# Patient Record
Sex: Male | Born: 1935 | Race: White | Hispanic: No | Marital: Married | Smoking: Former smoker
Health system: Southern US, Community
[De-identification: ages and names within clinical notes are randomized; demographics above are authoritative.]

## PROBLEM LIST (undated history)

## (undated) DIAGNOSIS — C801 Malignant (primary) neoplasm, unspecified: Secondary | ICD-10-CM

## (undated) HISTORY — PX: PACEMAKER INSERTION: SHX728

---

## 2007-12-28 ENCOUNTER — Emergency Department (HOSPITAL_BASED_OUTPATIENT_CLINIC_OR_DEPARTMENT_OTHER): Admission: EM | Admit: 2007-12-28 | Discharge: 2007-12-28 | Payer: Self-pay | Admitting: Emergency Medicine

## 2007-12-28 ENCOUNTER — Ambulatory Visit: Payer: Self-pay | Admitting: Diagnostic Radiology

## 2010-10-17 LAB — DIFFERENTIAL
Basophils Relative: 1 % (ref 0–1)
Eosinophils Relative: 1 % (ref 0–5)
Lymphocytes Relative: 16 % (ref 12–46)
Monocytes Absolute: 0.5 10*3/uL (ref 0.1–1.0)
Monocytes Relative: 7 % (ref 3–12)
Neutro Abs: 5.6 10*3/uL (ref 1.7–7.7)

## 2010-10-17 LAB — BASIC METABOLIC PANEL
CO2: 24 mEq/L (ref 19–32)
Calcium: 9.5 mg/dL (ref 8.4–10.5)
Chloride: 111 mEq/L (ref 96–112)
GFR calc Af Amer: >60 mL/min (ref >60)
Glucose, Bld: 97 mg/dL (ref 70–99)
Potassium: 4.7 mEq/L (ref 3.5–5.1)
Sodium: 146 mEq/L — ABNORMAL HIGH (ref 135–145)

## 2010-10-17 LAB — CBC
HCT: 48.7 % (ref 39.0–52.0)
Hemoglobin: 16.4 g/dL (ref 13.0–17.0)
MCHC: 33.7 g/dL (ref 30.0–36.0)
RBC: 4.98 MIL/uL (ref 4.22–5.81)

## 2010-10-17 LAB — APTT: aPTT: 42 seconds — ABNORMAL HIGH (ref 24–37)

## 2014-03-16 ENCOUNTER — Encounter (HOSPITAL_BASED_OUTPATIENT_CLINIC_OR_DEPARTMENT_OTHER): Payer: Self-pay | Admitting: *Deleted

## 2014-03-16 ENCOUNTER — Emergency Department (HOSPITAL_BASED_OUTPATIENT_CLINIC_OR_DEPARTMENT_OTHER)
Admission: EM | Admit: 2014-03-16 | Discharge: 2014-03-16 | Disposition: A | Discharge status: Home / Self Care | Payer: Medicare Other | Attending: Emergency Medicine | Admitting: Emergency Medicine

## 2014-03-16 ENCOUNTER — Emergency Department (HOSPITAL_BASED_OUTPATIENT_CLINIC_OR_DEPARTMENT_OTHER): Payer: Medicare Other

## 2014-03-16 DIAGNOSIS — S8992XA Unspecified injury of left lower leg, initial encounter: Secondary | ICD-10-CM | POA: Diagnosis present

## 2014-03-16 DIAGNOSIS — Z859 Personal history of malignant neoplasm, unspecified: Secondary | ICD-10-CM | POA: Diagnosis not present

## 2014-03-16 DIAGNOSIS — Y92810 Car as the place of occurrence of the external cause: Secondary | ICD-10-CM | POA: Diagnosis not present

## 2014-03-16 DIAGNOSIS — Z950 Presence of cardiac pacemaker: Secondary | ICD-10-CM | POA: Insufficient documentation

## 2014-03-16 DIAGNOSIS — Z7982 Long term (current) use of aspirin: Secondary | ICD-10-CM | POA: Diagnosis not present

## 2014-03-16 DIAGNOSIS — Z79899 Other long term (current) drug therapy: Secondary | ICD-10-CM | POA: Diagnosis not present

## 2014-03-16 DIAGNOSIS — X58XXXA Exposure to other specified factors, initial encounter: Secondary | ICD-10-CM | POA: Insufficient documentation

## 2014-03-16 DIAGNOSIS — S86912A Strain of unspecified muscle(s) and tendon(s) at lower leg level, left leg, initial encounter: Secondary | ICD-10-CM | POA: Diagnosis not present

## 2014-03-16 DIAGNOSIS — Y9389 Activity, other specified: Secondary | ICD-10-CM | POA: Insufficient documentation

## 2014-03-16 DIAGNOSIS — Y998 Other external cause status: Secondary | ICD-10-CM | POA: Diagnosis not present

## 2014-03-16 HISTORY — DX: Malignant (primary) neoplasm, unspecified: C80.1

## 2014-03-16 NOTE — Discharge Instructions (Signed)

## 2014-03-16 NOTE — ED Notes (Signed)
Pt lost his balance while using the "new" walker with wheels. States he heard a pop when he went down. Currently able to move leg/knee freely. ROM intact +pulses.

## 2014-03-16 NOTE — ED Notes (Signed)
Pt to triage in w/c, reports being treated "for water on my knee", then fell last night around 9pm. Lost his balance when getting out of the car, denies head injury or loc, states he heard a "pop" in left knee.

## 2014-03-16 NOTE — ED Provider Notes (Signed)
CSN: [id number]     Arrival date & time 03/16/14  1511 History   First MD Initiated Contact with Patient 03/16/14 1635     Chief Complaint  Patient presents with  . Knee Pain     (Consider location/radiation/quality/duration/timing/severity/associated sxs/prior Treatment) HPI Comments: Patient presents with left knee pain. Yesterday he was walking with a new walker that "got away from him" and he caught himself with his left leg. He twisted his left knee and felt a pop in his knee. He did not actually fall or hit his head. He denies any pain in his hip or his ankle. He has some pain and swelling in his left knee. He was having a hard time bearing weight on this morning it's been walking on it this afternoon. He denies any other injuries. He's been using Tylenol for pain.  Patient is a 81 y.o. male presenting with knee pain.  Knee Pain Associated symptoms: no back pain, no fever and no neck pain     Past Medical History  Diagnosis Date  . Cancer    Past Surgical History  Procedure Laterality Date  . Pacemaker insertion     History reviewed. No pertinent family history. History  Substance Use Topics  . Smoking status: Former Research scientist (life sciences)  . Smokeless tobacco: Not on file  . Alcohol Use: Not on file    Review of Systems  Constitutional: Negative for fever.  Gastrointestinal: Negative for nausea and vomiting.  Musculoskeletal: Positive for joint swelling and arthralgias. Negative for back pain and neck pain.  Skin: Negative for wound.  Neurological: Negative for weakness, numbness and headaches.      Allergies  Review of patient's allergies indicates no known allergies.  Home Medications   Prior to Admission medications   Medication Sig Start Date End Date Taking? Authorizing Provider  AMLODIPINE BESYLATE PO Take by mouth.   Yes Historical Provider, MD  ascorbic acid (VITAMIN C) 1000 MG tablet Take 1,000 mg by mouth daily.   Yes Historical Provider, MD  aspirin 81 MG tablet  Take 81 mg by mouth daily.   Yes Historical Provider, MD  b complex vitamins tablet Take 1 tablet by mouth daily.   Yes Historical Provider, MD  bicalutamide (CASODEX) 50 MG tablet Take 50 mg by mouth daily.   Yes Historical Provider, MD  bimatoprost (LUMIGAN) 0.01 % SOLN 1 drop at bedtime.   Yes Historical Provider, MD  calcium carbonate (OS-CAL) 600 MG TABS tablet Take 600 mg by mouth 2 (two) times daily with a meal.   Yes Historical Provider, MD  citalopram (CELEXA) 10 MG tablet Take 10 mg by mouth daily.   Yes Historical Provider, MD  dexlansoprazole (DEXILANT) 60 MG capsule Take 60 mg by mouth daily.   Yes Historical Provider, MD  nitroGLYCERIN (NITROSTAT) 0.4 MG SL tablet Place 0.4 mg under the tongue every 5 (five) minutes as needed for chest pain.   Yes Historical Provider, MD  simvastatin (ZOCOR) 20 MG tablet Take 20 mg by mouth daily.   Yes Historical Provider, MD  tamsulosin (FLOMAX) 0.4 MG CAPS capsule Take 0.4 mg by mouth.   Yes Historical Provider, MD  zolpidem (AMBIEN CR) 6.25 MG CR tablet Take 6.25 mg by mouth at bedtime as needed for sleep.   Yes Historical Provider, MD   There were no vitals taken for this visit. Physical Exam  Constitutional: He is oriented to person, place, and time. He appears well-developed and well-nourished.  HENT:  Head: Normocephalic and atraumatic.  Neck: Normal range of motion. Neck supple.  Cardiovascular: Normal rate.   Pulmonary/Chest: Effort normal.  Musculoskeletal: He exhibits edema and tenderness.  Mild swelling to the left knee. There is no bony tenderness. The ligaments appear grossly intact to Lachman maneuver, valgus and varus stress. He's able to do a straight leg raise. There is no pain on range of motion of the hip or the ankle. Pedal pulses are intact. He has normal sensation and motor function in the leg.  Neurological: He is alert and oriented to person, place, and time.  Skin: Skin is warm and dry.  Psychiatric: He has a normal  mood and affect.    ED Course  Procedures (including critical care time) Labs Review Labs Reviewed - No data to display  Imaging Review Dg Knee Complete 4 Views Left  03/16/2014   CLINICAL DATA:  Fall on left knee last night.  Heard a pop.  EXAM: LEFT KNEE - COMPLETE 4+ VIEW  COMPARISON:  None.  FINDINGS: Enthesopathic change at the quadriceps insertion and patellar tendon insertion. No acute fracture or dislocation. Possible small suprapatellar joint effusion. Mild medial and patellofemoral compartment joint space narrowing for age.  IMPRESSION: Possible small joint effusion.  No acute osseous abnormality.   Electronically Signed   By: Abigail Miyamoto M.D.   On: 03/16/2014 16:58     EKG Interpretation None      MDM   Final diagnoses:  Knee strain, left, initial encounter    Patient has been able to ambulate on the leg. Knee sleeve was placed. He was advised in ice and elevation. He will continue to use Tylenol for pain. He has an orthopedist at [facility] that he will follow-up with if this pain continues.    Malvin Johns, MD 03/16/14 [phone]

## 2014-03-27 ENCOUNTER — Emergency Department (HOSPITAL_BASED_OUTPATIENT_CLINIC_OR_DEPARTMENT_OTHER)
Admission: EM | Admit: 2014-03-27 | Discharge: 2014-03-27 | Disposition: A | Discharge status: Home / Self Care | Payer: Medicare Other | Attending: Emergency Medicine | Admitting: Emergency Medicine

## 2014-03-27 ENCOUNTER — Encounter (HOSPITAL_BASED_OUTPATIENT_CLINIC_OR_DEPARTMENT_OTHER): Payer: Self-pay | Admitting: *Deleted

## 2014-03-27 ENCOUNTER — Emergency Department (HOSPITAL_BASED_OUTPATIENT_CLINIC_OR_DEPARTMENT_OTHER): Payer: Medicare Other

## 2014-03-27 DIAGNOSIS — M25561 Pain in right knee: Secondary | ICD-10-CM

## 2014-03-27 DIAGNOSIS — M25551 Pain in right hip: Secondary | ICD-10-CM

## 2014-03-27 DIAGNOSIS — Z87891 Personal history of nicotine dependence: Secondary | ICD-10-CM | POA: Insufficient documentation

## 2014-03-27 DIAGNOSIS — Z859 Personal history of malignant neoplasm, unspecified: Secondary | ICD-10-CM | POA: Insufficient documentation

## 2014-03-27 DIAGNOSIS — S8991XA Unspecified injury of right lower leg, initial encounter: Secondary | ICD-10-CM | POA: Insufficient documentation

## 2014-03-27 DIAGNOSIS — Y9389 Activity, other specified: Secondary | ICD-10-CM | POA: Insufficient documentation

## 2014-03-27 DIAGNOSIS — Z7982 Long term (current) use of aspirin: Secondary | ICD-10-CM | POA: Diagnosis not present

## 2014-03-27 DIAGNOSIS — Y998 Other external cause status: Secondary | ICD-10-CM | POA: Insufficient documentation

## 2014-03-27 DIAGNOSIS — S79911A Unspecified injury of right hip, initial encounter: Secondary | ICD-10-CM | POA: Diagnosis not present

## 2014-03-27 DIAGNOSIS — W1839XA Other fall on same level, initial encounter: Secondary | ICD-10-CM | POA: Diagnosis not present

## 2014-03-27 DIAGNOSIS — Y9289 Other specified places as the place of occurrence of the external cause: Secondary | ICD-10-CM | POA: Insufficient documentation

## 2014-03-27 DIAGNOSIS — W19XXXA Unspecified fall, initial encounter: Secondary | ICD-10-CM

## 2014-03-27 MED ORDER — HYDROCODONE-ACETAMINOPHEN 5-325 MG PO TABS: 1.0000 | ORAL_TABLET | Freq: Four times a day (QID) | ORAL | Status: AC | PRN

## 2014-03-27 MED ORDER — HYDROCODONE-ACETAMINOPHEN 5-325 MG PO TABS
1.0000 | ORAL_TABLET | Freq: Once | ORAL | Status: AC
  Administered 2014-03-27: 1 via ORAL
  Filled 2014-03-27: qty 1

## 2014-03-27 NOTE — Discharge Instructions (Signed)
Knee Pain The knee is the complex joint between your thigh and your lower leg. It is made up of bones, tendons, ligaments, and cartilage. The bones that make up the knee are:  The femur in the thigh.  The tibia and fibula in the lower leg.  The patella or kneecap riding in the groove on the lower femur. CAUSES  Knee pain is a common complaint with many causes. A few of these causes are:  Injury, such as:  A ruptured ligament or tendon injury.  Torn cartilage.  Medical conditions, such as:  Gout  Arthritis  Infections  Overuse, over training, or overdoing a physical activity. Knee pain can be minor or severe. Knee pain can accompany debilitating injury. Minor knee problems often respond well to self-care measures or get well on their own. More serious injuries may need medical intervention or even surgery. SYMPTOMS The knee is complex. Symptoms of knee problems can vary widely. Some of the problems are:  Pain with movement and weight bearing.  Swelling and tenderness.  Buckling of the knee.  Inability to straighten or extend your knee.  Your knee locks and you cannot straighten it.  Warmth and redness with pain and fever.  Deformity or dislocation of the kneecap. DIAGNOSIS  Determining what is wrong may be very straight forward such as when there is an injury. It can also be challenging because of the complexity of the knee. Tests to make a diagnosis may include:  Your caregiver taking a history and doing a physical exam.  Routine X-rays can be used to rule out other problems. X-rays will not reveal a cartilage tear. Some injuries of the knee can be diagnosed by:  Arthroscopy a surgical technique by which a small video camera is inserted through tiny incisions on the sides of the knee. This procedure is used to examine and repair internal knee joint problems. Tiny instruments can be used during arthroscopy to repair the torn knee cartilage (meniscus).  Arthrography  is a radiology technique. A contrast liquid is directly injected into the knee joint. Internal structures of the knee joint then become visible on X-ray film.  An MRI scan is a non X-ray radiology procedure in which magnetic fields and a computer produce two- or three-dimensional images of the inside of the knee. Cartilage tears are often visible using an MRI scanner. MRI scans have largely replaced arthrography in diagnosing cartilage tears of the knee.  Blood work.  Examination of the fluid that helps to lubricate the knee joint (synovial fluid). This is done by taking a sample out using a needle and a syringe. TREATMENT The treatment of knee problems depends on the cause. Some of these treatments are:  Depending on the injury, proper casting, splinting, surgery, or physical therapy care will be needed.  Give yourself adequate recovery time. Do not overuse your joints. If you begin to get sore during workout routines, back off. Slow down or do fewer repetitions.  For repetitive activities such as cycling or running, maintain your strength and nutrition.  Alternate muscle groups. For example, if you are a weight lifter, work the upper body on one day and the lower body the next.  Either tight or weak muscles do not give the proper support for your knee. Tight or weak muscles do not absorb the stress placed on the knee joint. Keep the muscles surrounding the knee strong.  Take care of mechanical problems.  If you have flat feet, orthotics or special shoes may help.  See your caregiver if you need help.  Arch supports, sometimes with wedges on the inner or outer aspect of the heel, can help. These can shift pressure away from the side of the knee most bothered by osteoarthritis.  A brace called an "unloader" brace also may be used to help ease the pressure on the most arthritic side of the knee.  If your caregiver has prescribed crutches, braces, wraps or ice, use as directed. The acronym  for this is PRICE. This means protection, rest, ice, compression, and elevation.  Nonsteroidal anti-inflammatory drugs (NSAIDs), can help relieve pain. But if taken immediately after an injury, they may actually increase swelling. Take NSAIDs with food in your stomach. Stop them if you develop stomach problems. Do not take these if you have a history of ulcers, stomach pain, or bleeding from the bowel. Do not take without your caregiver's approval if you have problems with fluid retention, heart failure, or kidney problems.  For ongoing knee problems, physical therapy may be helpful.  Glucosamine and chondroitin are over-the-counter dietary supplements. Both may help relieve the pain of osteoarthritis in the knee. These medicines are different from the usual anti-inflammatory drugs. Glucosamine may decrease the rate of cartilage destruction.  Injections of a corticosteroid drug into your knee joint may help reduce the symptoms of an arthritis flare-up. They may provide pain relief that lasts a few months. You may have to wait a few months between injections. The injections do have a small increased risk of infection, water retention, and elevated blood sugar levels.  Hyaluronic acid injected into damaged joints may ease pain and provide lubrication. These injections may work by reducing inflammation. A series of shots may give relief for as long as 6 months.  Topical painkillers. Applying certain ointments to your skin may help relieve the pain and stiffness of osteoarthritis. Ask your pharmacist for suggestions. Many over the-counter products are approved for temporary relief of arthritis pain.  In some countries, doctors often prescribe topical NSAIDs for relief of chronic conditions such as arthritis and tendinitis. A review of treatment with NSAID creams found that they worked as well as oral medications but without the serious side effects. PREVENTION  Maintain a healthy weight. Extra pounds  put more strain on your joints.  Get strong, stay limber. Weak muscles are a common cause of knee injuries. Stretching is important. Include flexibility exercises in your workouts.  Be smart about exercise. If you have osteoarthritis, chronic knee pain or recurring injuries, you may need to change the way you exercise. This does not mean you have to stop being active. If your knees ache after jogging or playing basketball, consider switching to swimming, water aerobics, or other low-impact activities, at least for a few days a week. Sometimes limiting high-impact activities will provide relief.  Make sure your shoes fit well. Choose footwear that is right for your sport.  Protect your knees. Use the proper gear for knee-sensitive activities. Use kneepads when playing volleyball or laying carpet. Buckle your seat belt every time you drive. Most shattered kneecaps occur in car accidents.  Rest when you are tired. SEEK MEDICAL CARE IF:  You have knee pain that is continual and does not seem to be getting better.  SEEK IMMEDIATE MEDICAL CARE IF:  Your knee joint feels hot to the touch and you have a high fever. MAKE SURE YOU:   Understand these instructions.  Will watch your condition.  Will get help right away if you are not  doing well or get worse. Document Released: 10/26/2006 Document Revised: 03/23/2011 Document Reviewed: 10/26/2006 [facility] Patient Information 2015 [place], [place]. This information is not intended to replace advice given to you by your health care provider. Make sure you discuss any questions you have with your health care provider.  Hip Pain Your hip is the joint between your upper legs and your lower pelvis. The bones, cartilage, tendons, and muscles of your hip joint perform a lot of work each day supporting your body weight and allowing you to move around. Hip pain can range from a minor ache to severe pain in one or both of your hips. Pain may be felt on the inside  of the hip joint near the groin, or the outside near the buttocks and upper thigh. You may have swelling or stiffness as well.  HOME CARE INSTRUCTIONS   Take medicines only as directed by your health care provider.  Apply ice to the injured area:  Put ice in a plastic bag.  Place a towel between your skin and the bag.  Leave the ice on for 15-20 minutes at a time, 3-4 times a day.  Keep your leg raised (elevated) when possible to lessen swelling.  Avoid activities that cause pain.  Follow specific exercises as directed by your health care provider.  Sleep with a pillow between your legs on your most comfortable side.  Record how often you have hip pain, the location of the pain, and what it feels like. SEEK MEDICAL CARE IF:   You are unable to put weight on your leg.  Your hip is red or swollen or very tender to touch.  Your pain or swelling continues or worsens after 1 week.  You have increasing difficulty walking.  You have a fever. SEEK IMMEDIATE MEDICAL CARE IF:   You have fallen.  You have a sudden increase in pain and swelling in your hip. MAKE SURE YOU:   Understand these instructions.  Will watch your condition.  Will get help right away if you are not doing well or get worse. Document Released: 06/18/2009 Document Revised: 05/15/2013 Document Reviewed: 08/25/2012 [facility] Patient Information 2015 [place], [place]. This information is not intended to replace advice given to you by your health care provider. Make sure you discuss any questions you have with your health care provider.   Fall Prevention and Home Safety Falls cause injuries and can affect all age groups. It is possible to use preventive measures to significantly decrease the likelihood of falls. There are many simple measures which can make your home safer and prevent falls. OUTDOORS  Repair cracks and edges of walkways and driveways.  Remove high doorway thresholds.  Trim shrubbery on the  main path into your home.  Have good outside lighting.  Clear walkways of tools, rocks, debris, and clutter.  Check that handrails are not broken and are securely fastened. Both sides of steps should have handrails.  Have leaves, snow, and ice cleared regularly.  Use sand or salt on walkways during winter months.  In the garage, clean up grease or oil spills. BATHROOM  Install night lights.  Install grab bars by the toilet and in the tub and shower.  Use non-skid mats or decals in the tub or shower.  Place a plastic non-slip stool in the shower to sit on, if needed.  Keep floors dry and clean up all water on the floor immediately.  Remove soap buildup in the tub or shower on a regular basis.  Secure bath mats with non-slip, double-sided rug tape.  Remove throw rugs and tripping hazards from the floors. BEDROOMS  Install night lights.  Make sure a bedside light is easy to reach.  Do not use oversized bedding.  Keep a telephone by your bedside.  Have a firm chair with side arms to use for getting dressed.  Remove throw rugs and tripping hazards from the floor. KITCHEN  Keep handles on pots and pans turned toward the center of the stove. Use back burners when possible.  Clean up spills quickly and allow time for drying.  Avoid walking on wet floors.  Avoid hot utensils and knives.  Position shelves so they are not too high or low.  Place commonly used objects within easy reach.  If necessary, use a sturdy step stool with a grab bar when reaching.  Keep electrical cables out of the way.  Do not use floor polish or wax that makes floors slippery. If you must use wax, use non-skid floor wax.  Remove throw rugs and tripping hazards from the floor. STAIRWAYS  Never leave objects on stairs.  Place handrails on both sides of stairways and use them. Fix any loose handrails. Make sure handrails on both sides of the stairways are as long as the stairs.  Check  carpeting to make sure it is firmly attached along stairs. Make repairs to worn or loose carpet promptly.  Avoid placing throw rugs at the top or bottom of stairways, or properly secure the rug with carpet tape to prevent slippage. Get rid of throw rugs, if possible.  Have an electrician put in a light switch at the top and bottom of the stairs. OTHER FALL PREVENTION TIPS  Wear low-heel or rubber-soled shoes that are supportive and fit well. Wear closed toe shoes.  When using a stepladder, make sure it is fully opened and both spreaders are firmly locked. Do not climb a closed stepladder.  Add color or contrast paint or tape to grab bars and handrails in your home. Place contrasting color strips on first and last steps.  Learn and use mobility aids as needed. Install an electrical emergency response system.  Turn on lights to avoid dark areas. Replace light bulbs that burn out immediately. Get light switches that glow.  Arrange furniture to create clear pathways. Keep furniture in the same place.  Firmly attach carpet with non-skid or double-sided tape.  Eliminate uneven floor surfaces.  Select a carpet pattern that does not visually hide the edge of steps.  Be aware of all pets. OTHER HOME SAFETY TIPS  Set the water temperature for 120 F (48.8 C).  Keep emergency numbers on or near the telephone.  Keep smoke detectors on every level of the home and near sleeping areas. Document Released: 12/19/2001 Document Revised: 06/30/2011 Document Reviewed: 03/20/2011 [facility] Patient Information 2015 [place], [place]. This information is not intended to replace advice given to you by your health care provider. Make sure you discuss any questions you have with your health care provider.

## 2014-03-27 NOTE — ED Notes (Signed)
C/o fall last pm after standing and fell to right onto w/c and walker. C/o right groin pain. Also c/o right knee pain. No LOC.

## 2014-03-27 NOTE — ED Provider Notes (Signed)
TIME SEEN: 11:45 AM  CHIEF COMPLAINT: Fall, right hip pain  HPI: Pt is a 81 y.o. male with history of hypertension, GERD, hyperlipidemia who lives in independent living who presents emergency department with a fall and right hip pain. Reports that he was standing at the sink in his bathroom when he turned around too quickly and his walker was behind him and he fell on his right side. He is adamant that he did not hit his head or lose consciousness. Denies any preceding symptoms causing his fall including chest pain or shortness of breath, palpitations, dizziness, numbness, tingling or focal weakness. No recent infectious symptoms. No vomiting or diarrhea. He is only complaining of right hip pain. Patient is on aspirin 81 mg but no other anticoagulation.  ROS: See HPI Constitutional: no fever  Eyes: no drainage  ENT: no runny nose   Cardiovascular:  no chest pain  Resp: no SOB  GI: no vomiting GU: no dysuria Integumentary: no rash  Allergy: no hives  Musculoskeletal: no leg swelling  Neurological: no slurred speech ROS otherwise negative  PAST MEDICAL HISTORY/PAST SURGICAL HISTORY:  Past Medical History  Diagnosis Date  . Cancer     MEDICATIONS:  Prior to Admission medications   Medication Sig Start Date End Date Taking? Authorizing Provider  ascorbic acid (VITAMIN C) 1000 MG tablet Take 1,000 mg by mouth daily.   Yes Historical Provider, MD  aspirin 81 MG tablet Take 81 mg by mouth daily.   Yes Historical Provider, MD  bicalutamide (CASODEX) 50 MG tablet Take 50 mg by mouth daily.   Yes Historical Provider, MD  citalopram (CELEXA) 10 MG tablet Take 10 mg by mouth daily.   Yes Historical Provider, MD  dexlansoprazole (DEXILANT) 60 MG capsule Take 60 mg by mouth daily.   Yes Historical Provider, MD  geriatric multivitamins-minerals (ELDERTONIC/GEVRABON) ELIX Take 15 mLs by mouth daily.   Yes Historical Provider, MD  simvastatin (ZOCOR) 20 MG tablet Take 20 mg by mouth daily.   Yes  Historical Provider, MD  tamsulosin (FLOMAX) 0.4 MG CAPS capsule Take 0.4 mg by mouth.   Yes Historical Provider, MD  AMLODIPINE BESYLATE PO Take by mouth.    Historical Provider, MD  b complex vitamins tablet Take 1 tablet by mouth daily.    Historical Provider, MD  bimatoprost (LUMIGAN) 0.01 % SOLN 1 drop at bedtime.    Historical Provider, MD  calcium carbonate (OS-CAL) 600 MG TABS tablet Take 600 mg by mouth 2 (two) times daily with a meal.    Historical Provider, MD  nitroGLYCERIN (NITROSTAT) 0.4 MG SL tablet Place 0.4 mg under the tongue every 5 (five) minutes as needed for chest pain.    Historical Provider, MD  zolpidem (AMBIEN CR) 6.25 MG CR tablet Take 6.25 mg by mouth at bedtime as needed for sleep.    Historical Provider, MD    ALLERGIES:  No Known Allergies  SOCIAL HISTORY:  History  Substance Use Topics  . Smoking status: Former Research scientist (life sciences)  . Smokeless tobacco: Not on file  . Alcohol Use: Not on file    FAMILY HISTORY: No family history on file.  EXAM: BP 161/74 mmHg  Pulse 84  Temp(Src) 98.4 F (36.9 C) (Oral)  Resp 18  Ht 6\' 2"  (1.88 m)  Wt 210 lb (95.255 kg)  BMI 26.95 kg/m2  SpO2 97% CONSTITUTIONAL: Alert and oriented and responds appropriately to questions. Well-appearing; well-nourished; GCS 15 HEAD: Normocephalic; atraumatic EYES: Conjunctivae clear, PERRL, EOMI ENT: normal nose;  no rhinorrhea; moist mucous membranes; pharynx without lesions noted; no dental injury; no septal hematoma NECK: Supple, no meningismus, no LAD; no midline spinal tenderness, step-off or deformity CARD: RRR; S1 and S2 appreciated; no murmurs, no clicks, no rubs, no gallops RESP: Normal chest excursion without splinting or tachypnea; breath sounds clear and equal bilaterally; no wheezes, no rhonchi, no rales; chest wall stable, nontender to palpation ABD/GI: Normal bowel sounds; non-distended; soft, non-tender, no rebound, no guarding PELVIS:  stable, tender to palpation over the  right anterior interior hip without leg length discrepancy BACK:  The back appears normal and is non-tender to palpation, there is no CVA tenderness; no midline spinal tenderness, step-off or deformity EXT: Tender to palpation of the right anterior hip without leg length discrepancy, 2+ DP pulses bilaterally, sensation to light touch intact diffusely, no obvious bony deformity, no erythema or warmth, compartments are soft, decreased range of motion in the right hip secondary to pain, otherwise Normal ROM in all joints; otherwise extremities are non-tender to palpation; no edema; normal capillary refill; no cyanosis; no tenderness over the right distal femur or knee or tib/fib on the right side or ankle SKIN: Normal color for age and race; warm NEURO: Moves all extremities equally, sensation to light touch intact diffusely, cranial nerves II through XII intact PSYCH: The patient's mood and manner are appropriate. Grooming and personal hygiene are appropriate.  MEDICAL DECISION MAKING: Patient here with mechanical fall with right hip pain. We'll obtain x-rays, give Vicodin. No other injury on exam. Denies preceding symptoms that led to the fall. States he turned around too quickly and lost his balance. He is adamant that he did not hit his head. No neck or back pain on exam. Neurologically intact, hemodynamically stable. It is not on anticoagulation.  ED PROGRESS: X-ray show no acute injury. Will ambulate patient in the ED.   Patient son reports that he has not been ambulatory since a fall at the beginning of March 3 and left knee pain. He has a wheelchair that he uses at home. Patient is able to stand but not able to ambulate but this is secondary to being unsteady which is chronic. He is complaining of some right knee pain currently and has some tenderness now over the anterior knee without bony deformity or joint effusion. Will obtain an x-ray of this knee. No pain in the hip with standing. He is able  to stand for several minutes with minimal assistance.    Right knee x-ray is unremarkable. We'll discharge home with prescription for Vicodin for pain. He has a wheelchair and walker at home. He lives at [facility]. There is a primary care physician on site that he can see. Discussed return percussion with patient and son at bedside verbalize understanding and are comfortable with plan.  Gorman, DO 03/27/14 [id number]

## 2014-03-27 NOTE — ED Notes (Signed)
Called [facility] to transfer pt back to [facility].

## 2014-06-09 ENCOUNTER — Encounter (HOSPITAL_BASED_OUTPATIENT_CLINIC_OR_DEPARTMENT_OTHER): Payer: Self-pay | Admitting: *Deleted

## 2014-06-09 ENCOUNTER — Emergency Department (HOSPITAL_BASED_OUTPATIENT_CLINIC_OR_DEPARTMENT_OTHER)
Admission: EM | Admit: 2014-06-09 | Discharge: 2014-06-09 | Disposition: A | Discharge status: Home / Self Care | Payer: Medicare Other | Attending: Emergency Medicine | Admitting: Emergency Medicine

## 2014-06-09 DIAGNOSIS — H1132 Conjunctival hemorrhage, left eye: Secondary | ICD-10-CM

## 2014-06-09 DIAGNOSIS — Z7982 Long term (current) use of aspirin: Secondary | ICD-10-CM | POA: Insufficient documentation

## 2014-06-09 DIAGNOSIS — Z79899 Other long term (current) drug therapy: Secondary | ICD-10-CM | POA: Diagnosis not present

## 2014-06-09 DIAGNOSIS — H5712 Ocular pain, left eye: Secondary | ICD-10-CM | POA: Diagnosis present

## 2014-06-09 DIAGNOSIS — Z859 Personal history of malignant neoplasm, unspecified: Secondary | ICD-10-CM | POA: Insufficient documentation

## 2014-06-09 DIAGNOSIS — Z87891 Personal history of nicotine dependence: Secondary | ICD-10-CM | POA: Insufficient documentation

## 2014-06-09 LAB — CBC
HCT: 39.1 % (ref 39.0–52.0)
Hemoglobin: 12.4 g/dL — ABNORMAL LOW (ref 13.0–17.0)
MCH: 29.2 pg (ref 26.0–34.0)
MCHC: 31.7 g/dL (ref 30.0–36.0)
MCV: 92.2 fL (ref 78.0–100.0)
Platelets: 121 10*3/uL — ABNORMAL LOW (ref 150–400)
RBC: 4.24 MIL/uL (ref 4.22–5.81)
RDW: 15.8 % — ABNORMAL HIGH (ref 11.5–15.5)
WBC: 5.1 10*3/uL (ref 4.0–10.5)

## 2014-06-09 MED ORDER — FLUORESCEIN SODIUM 1 MG OP STRP
1.0000 | ORAL_STRIP | Freq: Once | OPHTHALMIC | Status: AC
Start: 2014-06-09 — End: 2014-06-09
  Administered 2014-06-09: 1 via OPHTHALMIC
  Filled 2014-06-09: qty 1

## 2014-06-09 NOTE — ED Provider Notes (Signed)
CSN: [id number]     Arrival date & time 06/09/14  1900 History   This chart was scribed for Kyle Greek, MD by Randa Evens, ED Scribe. This patient was seen in room MH06/MH06 and the patient's care was started at 7:20 PM.     Chief Complaint  Patient presents with  . Eye Pain   Patient is a 81 y.o. male presenting with eye pain. The history is provided by the patient. No language interpreter was used.  Eye Pain   HPI Comments: Kyle Wolf is a 81 y.o. male who presents to the Emergency Department complaining of new left eye pain onset this evening. Pt has associated eye redness. Pt states he is unsure if there was injury to the eye but states that he may have swiped it while sorting through mail. Denies being on blood thinners. Pt denies sneezing or coughing.   Past Medical History  Diagnosis Date  . Cancer    Past Surgical History  Procedure Laterality Date  . Pacemaker insertion     No family history on file. History  Substance Use Topics  . Smoking status: Former Research scientist (life sciences)  . Smokeless tobacco: Not on file  . Alcohol Use: No    Review of Systems  HENT: Negative for sneezing.   Eyes: Positive for pain and redness.  Respiratory: Negative for cough.   All other systems reviewed and are negative.    Allergies  Review of patient's allergies indicates no known allergies.  Home Medications   Prior to Admission medications   Medication Sig Start Date End Date Taking? Authorizing Provider  Mirabegron (MYRBETRIQ PO) Take by mouth.   Yes Historical Provider, MD  AMLODIPINE BESYLATE PO Take by mouth.    Historical Provider, MD  ascorbic acid (VITAMIN C) 1000 MG tablet Take 1,000 mg by mouth daily.    Historical Provider, MD  aspirin 81 MG tablet Take 81 mg by mouth daily.    Historical Provider, MD  b complex vitamins tablet Take 1 tablet by mouth daily.    Historical Provider, MD  bicalutamide (CASODEX) 50 MG tablet Take 50 mg by mouth daily.    Historical  Provider, MD  bimatoprost (LUMIGAN) 0.01 % SOLN 1 drop at bedtime.    Historical Provider, MD  calcium carbonate (OS-CAL) 600 MG TABS tablet Take 600 mg by mouth 2 (two) times daily with a meal.    Historical Provider, MD  citalopram (CELEXA) 10 MG tablet Take 10 mg by mouth daily.    Historical Provider, MD  dexlansoprazole (DEXILANT) 60 MG capsule Take 60 mg by mouth daily.    Historical Provider, MD  geriatric multivitamins-minerals (ELDERTONIC/GEVRABON) ELIX Take 15 mLs by mouth daily.    Historical Provider, MD  HYDROcodone-acetaminophen (NORCO/VICODIN) 5-325 MG per tablet Take 1 tablet by mouth every 6 (six) hours as needed. 03/27/14   Kristen N Ward, DO  nitroGLYCERIN (NITROSTAT) 0.4 MG SL tablet Place 0.4 mg under the tongue every 5 (five) minutes as needed for chest pain.    Historical Provider, MD  simvastatin (ZOCOR) 20 MG tablet Take 20 mg by mouth daily.    Historical Provider, MD  tamsulosin (FLOMAX) 0.4 MG CAPS capsule Take 0.4 mg by mouth.    Historical Provider, MD  zolpidem (AMBIEN CR) 6.25 MG CR tablet Take 6.25 mg by mouth at bedtime as needed for sleep.    Historical Provider, MD   BP 129/69 mmHg  Pulse 77  Temp(Src) 97.5 F (36.4 C) (Oral)  Resp  18  Ht 6\' 2"  (1.88 m)  Wt 200 lb (90.719 kg)  BMI 25.67 kg/m2  SpO2 98%   Physical Exam  Constitutional: He is oriented to person, place, and time. He appears well-developed and well-nourished. No distress.  HENT:  Head: Normocephalic and atraumatic.  Right Ear: Hearing normal.  Left Ear: Hearing normal.  Nose: Nose normal.  Mouth/Throat: Oropharynx is clear and moist and mucous membranes are normal.  Eyes: EOM are normal. Pupils are equal, round, and reactive to light. Left conjunctiva has a hemorrhage.  Left eye circumferential subconjunctival hemorrhage  fluoroscein exam reveals normal cornea, no uptake.  Neck: Normal range of motion. Neck supple.  Cardiovascular: Regular rhythm, S1 normal and S2 normal.  Exam  reveals no gallop and no friction rub.   No murmur heard. Pulmonary/Chest: Effort normal and breath sounds normal. No respiratory distress. He exhibits no tenderness.  Abdominal: Soft. Normal appearance and bowel sounds are normal. There is no hepatosplenomegaly. There is no tenderness. There is no rebound, no guarding, no tenderness at McBurney's point and negative Murphy's sign. No hernia.  Musculoskeletal: Normal range of motion.  Neurological: He is alert and oriented to person, place, and time. He has normal strength. No cranial nerve deficit or sensory deficit. Coordination normal. GCS eye subscore is 4. GCS verbal subscore is 5. GCS motor subscore is 6.  Skin: Skin is warm, dry and intact. No rash noted. No cyanosis.  Psychiatric: He has a normal mood and affect. His speech is normal and behavior is normal. Thought content normal.  Nursing note and vitals reviewed.   ED Course  Procedures (including critical care time) DIAGNOSTIC STUDIES: Oxygen Saturation is 98% on RA, normal by my interpretation.    COORDINATION OF CARE: 7:23 PM-Discussed treatment plan with pt at bedside and pt agreed to plan.     Labs Review Labs Reviewed - No data to display  Imaging Review No results found.   EKG Interpretation None      MDM   Final diagnoses:  None  subconjunctival hemorrhage  presented to the ER for evaluation of subconjunctival hemorrhage.patient was unaware of any injury to the eye. He has not had any change in his vision. Son reports that it started with some bleeding on the outside corner of the eye, then the whole eye filled with blood. Examination reveals a circumferential subconjunctival hemorrhage corneal exam is normal. Patient does have mild pancytopenia, but is at his baseline. No further intervention necessary.  I personally performed the services described in this documentation, which was scribed in my presence. The recorded information has been reviewed and is  accurate.      Kyle Greek, MD 06/09/14 2011

## 2014-06-09 NOTE — Discharge Instructions (Signed)
Subconjunctival Hemorrhage °A subconjunctival hemorrhage is a bright red patch covering a portion of the white of the eye. The white part of the eye is called the sclera, and it is covered by a thin membrane called the conjunctiva. This membrane is clear, except for tiny blood vessels that you can see with the naked eye. When your eye is irritated or inflamed and becomes red, it is because the vessels in the conjunctiva are swollen. °Sometimes, a blood vessel in the conjunctiva can break and bleed. When this occurs, the blood builds up between the conjunctiva and the sclera, and spreads out to create a red area. The red spot may be very small at first. It may then spread to cover a larger part of the surface of the eye, or even all of the visible white part of the eye. °In almost all cases, the blood will go away and the eye will become white again. Before completely dissolving, however, the red area may spread. It may also become brownish-yellow in color before going away. If a lot of blood collects under the conjunctiva, it may look like a bulge on the surface of the eye. This looks scary, but it will also eventually flatten out and go away. Subconjunctival hemorrhages do not cause pain, but if swollen, may cause a feeling of irritation. There is no effect on vision.  °CAUSES  °· The most common cause is mild trauma (rubbing the eye, irritation). °· Subconjunctival hemorrhages can happen because of coughing or straining (lifting heavy objects), vomiting, or sneezing. °· In some cases, your doctor may want to check your blood pressure. High blood pressure can also cause a subconjunctival hemorrhage. °· Severe trauma or blunt injuries. °· Diseases that affect blood clotting (hemophilia, leukemia). °· Abnormalities of blood vessels behind the eye (carotid cavernous sinus fistula). °· Tumors behind the eye. °· Certain drugs (aspirin, Coumadin, heparin). °· Recent eye surgery. °HOME CARE INSTRUCTIONS  °· Do not worry  about the appearance of your eye. You may continue your usual activities. °· Often, follow-up is not necessary. °SEEK MEDICAL CARE IF:  °· Your eye becomes painful. °· The bleeding does not disappear within 3 weeks. °· Bleeding occurs elsewhere, for example, under the skin, in the mouth, or in the other eye. °· You have recurring subconjunctival hemorrhages. °SEEK IMMEDIATE MEDICAL CARE IF:  °· Your vision changes or you have difficulty seeing. °· You develop a severe headache, persistent vomiting, confusion, or abnormal drowsiness (lethargy). °· Your eye seems to bulge or protrude from the eye socket. °· You notice the sudden appearance of bruises or have spontaneous bleeding elsewhere on your body. °Document Released: 12/29/2004 Document Revised: 05/15/2013 Document Reviewed: 11/26/2008 °[employer]® Patient Information ©2015 [employer]. This information is not intended to replace advice given to you by your health care provider. Make sure you discuss any questions you have with your health care provider. ° °

## 2014-06-09 NOTE — ED Notes (Signed)
Pt c/o left eye hurting that started this evening. Injury unknown but states he was sorting his mail and could have hit his eye. Bloody sclera noted on exam. Denies any vision problems.

## 2016-04-18 IMAGING — CR DG KNEE COMPLETE 4+V*L*
4 series · 4 of 4 positions shown · non-contrast
Comparison: None.

CLINICAL DATA: Fall on left knee last night.  Heard a pop.

EXAM:
LEFT KNEE - COMPLETE 4+ VIEW

[t knee ap left]
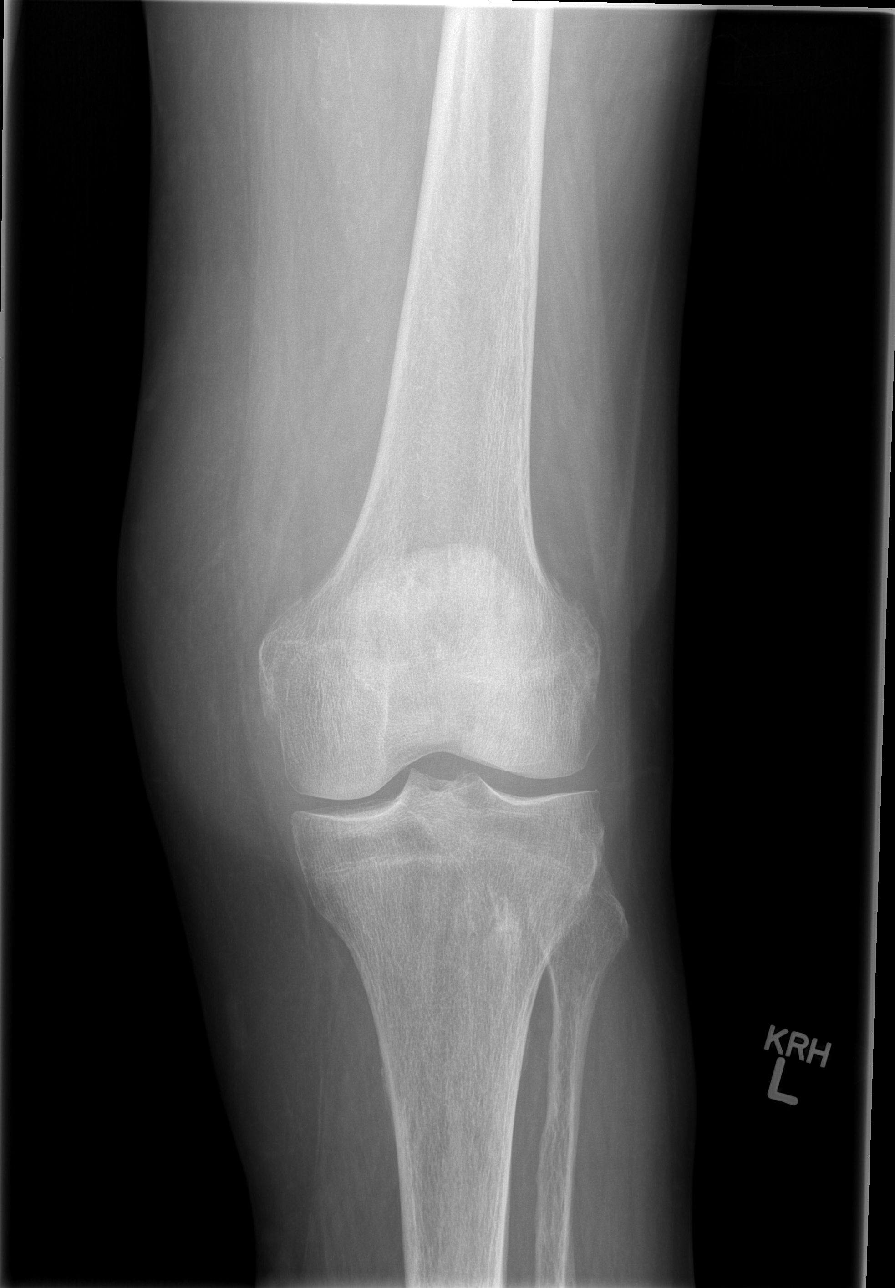

[t knee oblique left (1 of 2)]
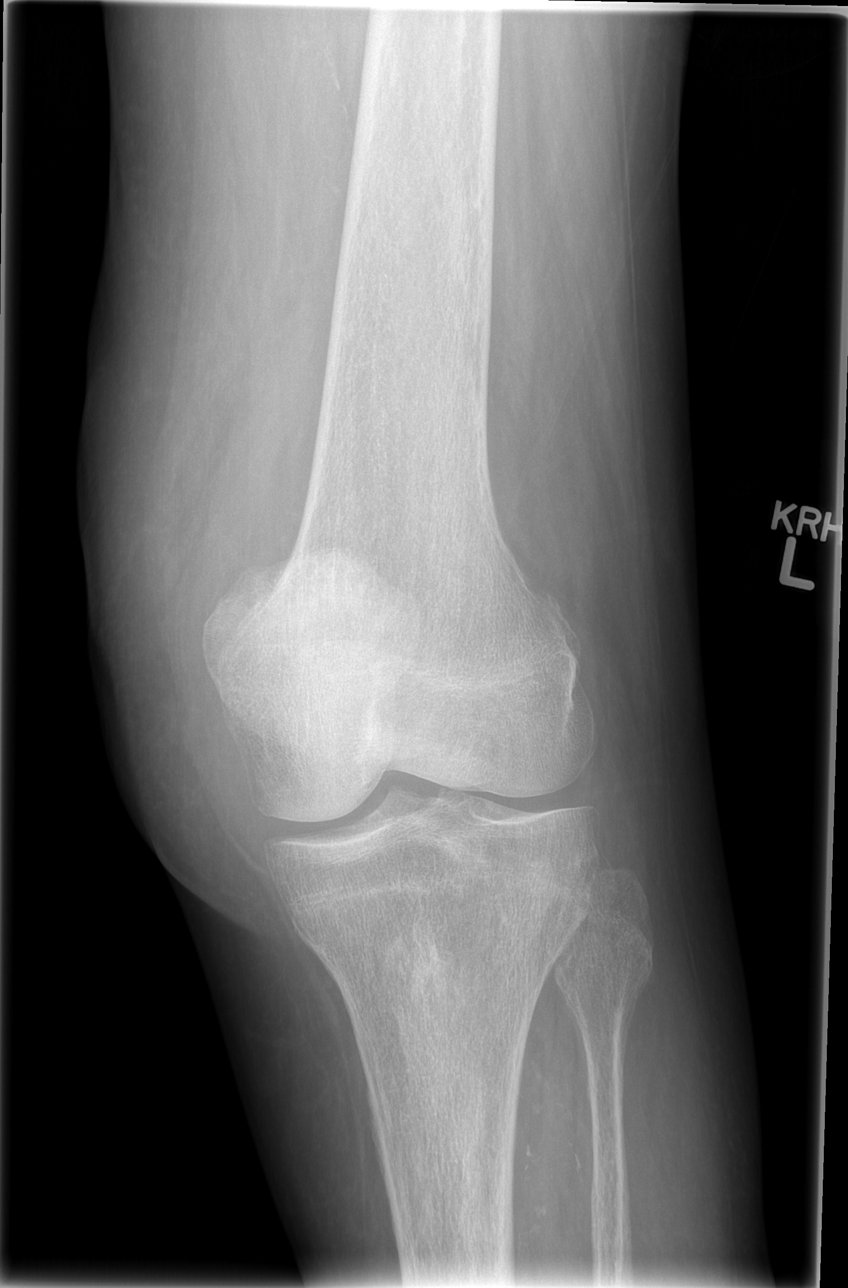

[t knee oblique left (2 of 2)]
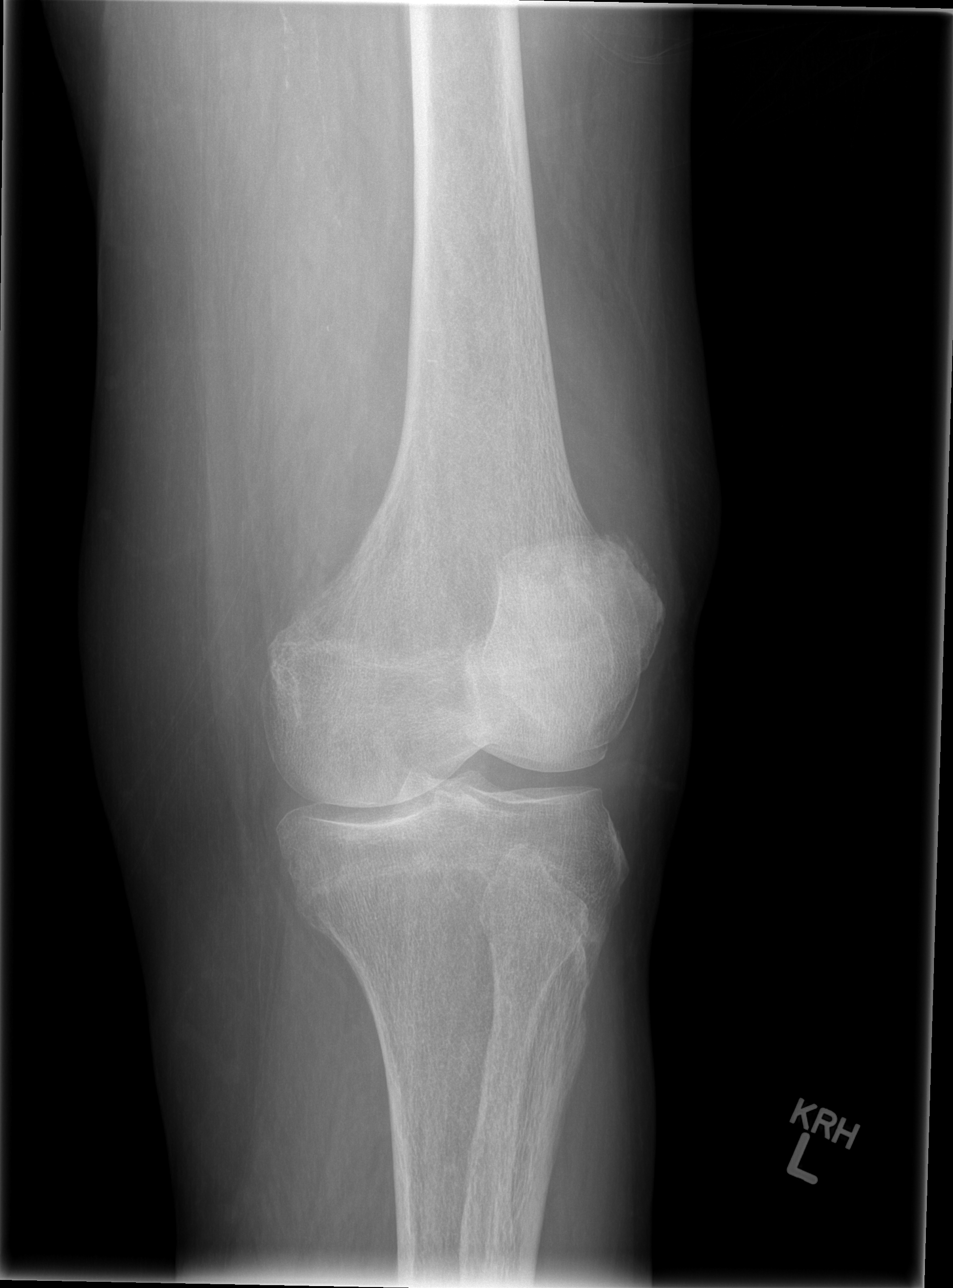

[t knee lat left]
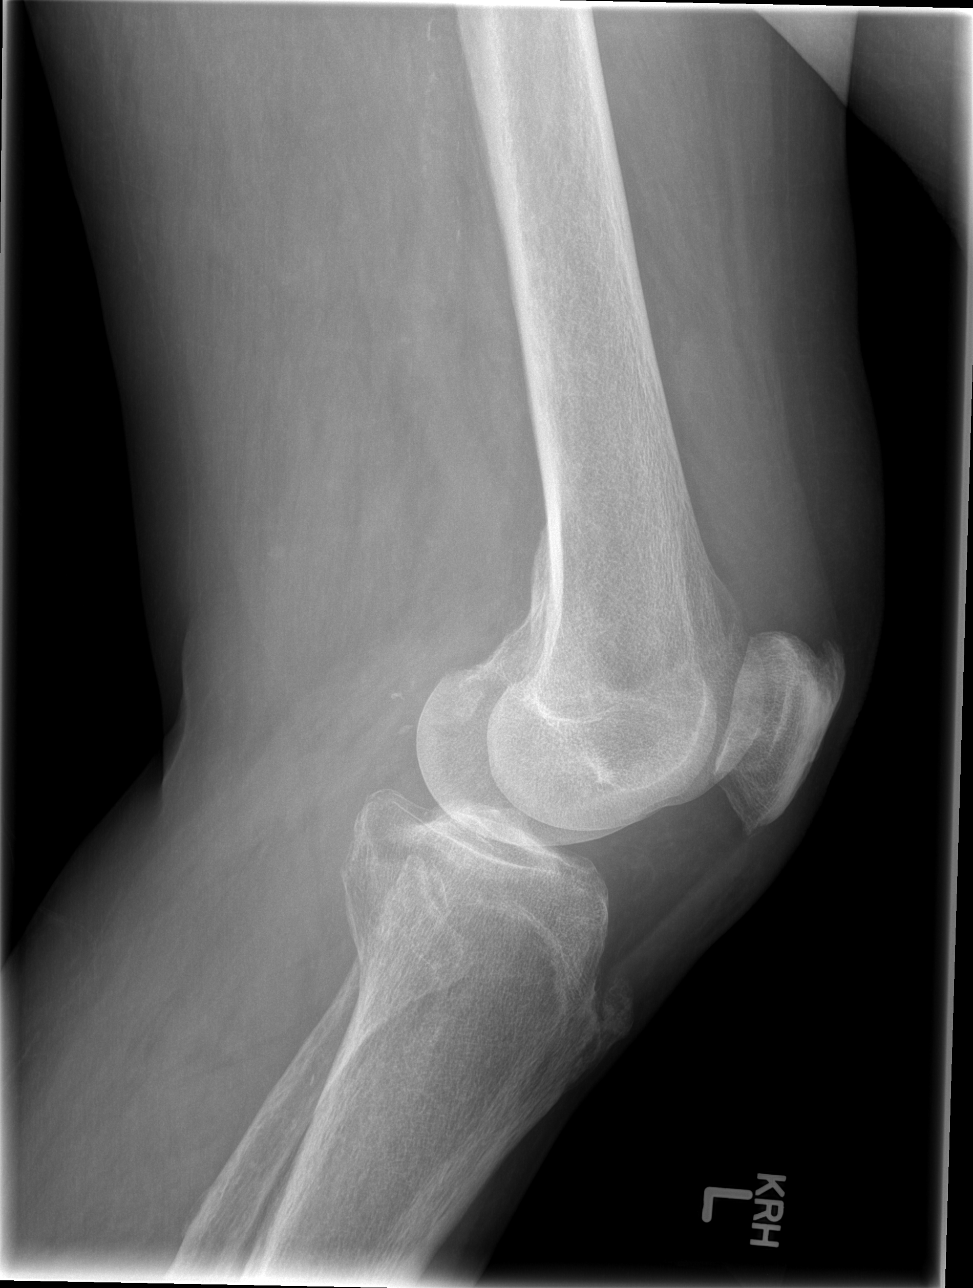

[4 of 4 positions shown; findings below may reference images not displayed]

FINDINGS: Enthesopathic change at the quadriceps insertion and patellar tendon
insertion. No acute fracture or dislocation. Possible small
suprapatellar joint effusion. Mild medial and patellofemoral
compartment joint space narrowing for age.
IMPRESSION: Possible small joint effusion.  No acute osseous abnormality.

## 2019-02-13 DEATH — deceased
